# Patient Record
Sex: Male | Born: 1950 | Race: White | Hispanic: No | Marital: Married | Smoking: Former smoker
Health system: Southern US, Community
[De-identification: ages and names within clinical notes are randomized; demographics above are authoritative.]

## PROBLEM LIST (undated history)

## (undated) DIAGNOSIS — E785 Hyperlipidemia, unspecified: Secondary | ICD-10-CM

## (undated) DIAGNOSIS — I519 Heart disease, unspecified: Secondary | ICD-10-CM

## (undated) DIAGNOSIS — I219 Acute myocardial infarction, unspecified: Secondary | ICD-10-CM

## (undated) DIAGNOSIS — F419 Anxiety disorder, unspecified: Secondary | ICD-10-CM

## (undated) DIAGNOSIS — I499 Cardiac arrhythmia, unspecified: Secondary | ICD-10-CM

## (undated) DIAGNOSIS — J45909 Unspecified asthma, uncomplicated: Secondary | ICD-10-CM

## (undated) DIAGNOSIS — I10 Essential (primary) hypertension: Secondary | ICD-10-CM

## (undated) DIAGNOSIS — K219 Gastro-esophageal reflux disease without esophagitis: Secondary | ICD-10-CM

## (undated) HISTORY — PX: CORONARY ANGIOPLASTY WITH STENT PLACEMENT: SHX49

## (undated) HISTORY — PX: OTHER SURGICAL HISTORY: SHX169

## (undated) HISTORY — DX: Hyperlipidemia, unspecified: E78.5

## (undated) HISTORY — DX: Anxiety disorder, unspecified: F41.9

## (undated) HISTORY — DX: Cardiac arrhythmia, unspecified: I49.9

## (undated) HISTORY — DX: Gastro-esophageal reflux disease without esophagitis: K21.9

## (undated) HISTORY — DX: Unspecified asthma, uncomplicated: J45.909

## (undated) HISTORY — DX: Heart disease, unspecified: I51.9

## (undated) HISTORY — DX: Acute myocardial infarction, unspecified: I21.9

## (undated) HISTORY — DX: Essential (primary) hypertension: I10

---

## 2006-08-26 ENCOUNTER — Ambulatory Visit: Payer: Self-pay | Admitting: Emergency Medicine

## 2006-08-26 ENCOUNTER — Other Ambulatory Visit: Payer: Self-pay

## 2008-08-06 ENCOUNTER — Inpatient Hospital Stay: Payer: Self-pay | Admitting: Internal Medicine

## 2010-04-28 ENCOUNTER — Other Ambulatory Visit: Payer: Self-pay | Admitting: Emergency Medicine

## 2010-05-01 ENCOUNTER — Ambulatory Visit: Payer: Self-pay | Admitting: Emergency Medicine

## 2010-05-09 ENCOUNTER — Ambulatory Visit: Payer: Self-pay | Admitting: Emergency Medicine

## 2014-02-10 ENCOUNTER — Ambulatory Visit: Payer: Self-pay | Admitting: Specialist

## 2017-03-17 ENCOUNTER — Encounter: Payer: Self-pay | Admitting: Urology

## 2017-03-17 ENCOUNTER — Ambulatory Visit: Payer: BLUE CROSS/BLUE SHIELD | Admitting: Urology

## 2017-03-17 VITALS — BP 183/90 | HR 62 | Ht 71.0 in | Wt 310.0 lb

## 2017-03-17 DIAGNOSIS — R350 Frequency of micturition: Secondary | ICD-10-CM

## 2017-03-17 DIAGNOSIS — N401 Enlarged prostate with lower urinary tract symptoms: Secondary | ICD-10-CM | POA: Diagnosis not present

## 2017-03-17 DIAGNOSIS — N138 Other obstructive and reflux uropathy: Secondary | ICD-10-CM

## 2017-03-17 DIAGNOSIS — R3129 Other microscopic hematuria: Secondary | ICD-10-CM | POA: Diagnosis not present

## 2017-03-17 LAB — BLADDER SCAN AMB NON-IMAGING: SCAN RESULT: 44

## 2017-03-18 ENCOUNTER — Encounter: Payer: Self-pay | Admitting: Urology

## 2017-03-18 DIAGNOSIS — R3129 Other microscopic hematuria: Secondary | ICD-10-CM | POA: Insufficient documentation

## 2017-03-18 DIAGNOSIS — N138 Other obstructive and reflux uropathy: Secondary | ICD-10-CM | POA: Insufficient documentation

## 2017-03-18 DIAGNOSIS — N401 Enlarged prostate with lower urinary tract symptoms: Principal | ICD-10-CM | POA: Insufficient documentation

## 2017-03-18 LAB — URINALYSIS, COMPLETE
BILIRUBIN UA: NEGATIVE
GLUCOSE, UA: NEGATIVE
KETONES UA: NEGATIVE
NITRITE UA: NEGATIVE
Specific Gravity, UA: 1.02 (ref 1.005–1.030)
UUROB: 0.2 mg/dL (ref 0.2–1.0)
pH, UA: 6 (ref 5.0–7.5)

## 2017-03-18 LAB — MICROSCOPIC EXAMINATION: Epithelial Cells (non renal): NONE SEEN /hpf (ref 0–10)

## 2017-03-18 NOTE — Progress Notes (Signed)
03/17/2017 7:31 AM   Travis ShaggyAndrew Graham Vinton Jr. 01/25/1951 [id number]   Chief Complaint  Patient presents with  . New Patient (Initial Visit)  . Urinary Frequency    HPI: Travis Miller is a 66 year old male who presents for evaluation of the lower urinary tract symptoms.  He relates to a long history of progressively worsening lower urinary tract symptoms.  His voiding symptoms include urinary frequency, urgency, intermittent urinary stream, urinary hesitancy and straining to urinate.  He denies dysuria or gross hematuria.  He denies flank, abdominal, pelvic or scrotal pain.  He was on tamsulosin however states he "felt horrible" on this medication and could not tolerate.  He has been on dutasteride for approximately 2 months and states she is already seeing improvement in his voiding pattern however does suffer some sexual side effects of decreased libido.  He has done research on minimally invasive treatment options and was particularly interested in having a prostatic artery embolization.  He had a friend treated with uro-lift who did not have a good outcome and he is not interested in this form of therapy.  He has done some reading on Rezum and is interested in this treatment also.   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  He states he last saw a urologist 20 years ago and had a negative cystoscopy and upper tract imaging.  He is a former smoker and quit 26 years ago.   PMH: Past Medical History:  Diagnosis Date  . Acid reflux   . Anxiety   . Arrhythmia   . Asthma   . Heart attack (HCC)   . Heart disease   . Hyperlipidemia   . Hypertension     Surgical History: Past Surgical History:  Procedure Laterality Date  . broken ankle    . CORONARY ANGIOPLASTY WITH STENT PLACEMENT      Home Medications:  Allergies as of 03/17/2017      Reactions   Flomax [tamsulosin]       Medication List        Accurate as of  03/17/17 11:59 PM. Always use your most recent med list.          aspirin EC 81 MG tablet Take 81 mg by mouth daily.   atenolol 25 MG tablet Commonly known as:  TENORMIN   dutasteride 0.5 MG capsule Commonly known as:  AVODART Take by mouth daily.   PARoxetine 20 MG tablet Commonly known as:  PAXIL Take 20 mg by mouth daily.   ramipril 10 MG capsule Commonly known as:  ALTACE   rosuvastatin 20 MG tablet Commonly known as:  CRESTOR Take 20 mg by mouth daily.   Vitamin D (Ergocalciferol) 50000 units Caps capsule Commonly known as:  DRISDOL Take by mouth.       Allergies:  Allergies  Allergen Reactions  . Flomax [Tamsulosin]     Family History: Family History  Problem Relation Age of Onset  . Tuberculosis Sister   . Kidney cancer Neg Hx   . Kidney disease Neg Hx   . Prostate cancer Neg Hx     Social History:  reports that he has quit smoking. He does not have any smokeless tobacco history on file. He reports that he does not drink alcohol or use drugs.  ROS: UROLOGY Frequent Urination?: Yes Hard to postpone urination?: Yes Burning/pain with urination?: No Get up at night to urinate?: Yes Leakage of urine?: Yes Urine stream  starts and stops?: Yes Trouble starting stream?: Yes Do you have to strain to urinate?: Yes Blood in urine?: No Urinary tract infection?: No Sexually transmitted disease?: No Injury to kidneys or bladder?: No Painful intercourse?: No Weak stream?: Yes Erection problems?: Yes Penile pain?: No  Gastrointestinal Nausea?: No Vomiting?: No Indigestion/heartburn?: No Diarrhea?: No Constipation?: No  Constitutional Fever: No Night sweats?: No Weight loss?: No Fatigue?: No  Skin Skin rash/lesions?: Yes Itching?: Yes  Eyes Blurred vision?: No Double vision?: No  Ears/Nose/Throat Sore throat?: No Sinus problems?: No  Hematologic/Lymphatic Swollen glands?: No Easy bruising?: No  Cardiovascular Leg swelling?:  Yes Chest pain?: No  Respiratory Cough?: No Shortness of breath?: No  Endocrine Excessive thirst?: No  Musculoskeletal Back pain?: Yes Joint pain?: Yes  Neurological Headaches?: No Dizziness?: No  Psychologic Depression?: No Anxiety?: No  Physical Exam: BP (!) 183/90   Pulse 62   Ht 5\' 11"  (1.803 m)   Wt (!) 310 lb (140.6 kg)   BMI 43.24 kg/m   Constitutional:  Alert and oriented, No acute distress. HEENT: [place] AT, moist mucus membranes.  Trachea midline, no masses. Cardiovascular: No clubbing, cyanosis, or edema. Respiratory: Normal respiratory effort, no increased work of breathing. GI: Abdomen is soft, nontender, nondistended, no abdominal masses GU: No CVA tenderness.  Prostate 50 g, smooth without nodules. Skin: No rashes, bruises or suspicious lesions. Lymph: No cervical or inguinal adenopathy. Neurologic: Grossly intact, no focal deficits, moving all 4 extremities. Psychiatric: Normal mood and affect.  Laboratory Data:  Urinalysis Dipstick 1+ leukocytes, 2+ blood, 1+ protein/microscopy 6-10 WBC/3-10 RBC.  Pertinent Imaging: N/A  Assessment & Plan:   1.  BPH with lower urinary tract symptoms I did discuss prostatic artery embolization and he was informed that this typically is not a first or second line procedure and is generally not covered by insurance.  He was offered [facility] if he desires additional information.  We discussed Rezum and he was informed that we are currently not doing this procedure here however there are several practices in the [place] area who have been doing for 2+ years.  He was informed that he will most likely continue to receive benefit from the dutasteride after he has been on this medication for 4-6 months.  He would like to continue this medication and reassess after he has been on for 6 months.  I did discuss PSA screening and reviewed current AUA recommendations.  He would like to pursue  however since he has been on dutasteride for 2 months will check this after he has been on approximately 6 months.  PVR by bladder scan was 44 mL.  - Urinalysis, Complete - BLADDER SCAN AMB NON-IMAGING  2. Microscopic hematuria Urinalysis today did show pyuria and microhematuria.  Urine culture was ordered.  He would like to proceed with reassessment of his microhematuria and will schedule a renal ultrasound and cystoscopy.  - Ultrasound renal complete; Future - CULTURE, URINE COMPREHENSIVE    Riki AltesScott C Infantof Villagomez, MD  [facility] [address], [address] [place], [place] [address] [phone]

## 2017-03-19 LAB — CULTURE, URINE COMPREHENSIVE

## 2017-05-07 ENCOUNTER — Encounter: Payer: Self-pay | Admitting: Urology

## 2017-05-07 ENCOUNTER — Ambulatory Visit
Admission: RE | Admit: 2017-05-07 | Discharge: 2017-05-07 | Disposition: A | Discharge status: Home / Self Care | Payer: BLUE CROSS/BLUE SHIELD | Source: Ambulatory Visit | Attending: Urology | Admitting: Urology

## 2017-05-07 ENCOUNTER — Ambulatory Visit: Payer: BLUE CROSS/BLUE SHIELD | Admitting: Urology

## 2017-05-07 VITALS — BP 173/78 | HR 68 | Ht 71.0 in | Wt 300.0 lb

## 2017-05-07 DIAGNOSIS — R3129 Other microscopic hematuria: Secondary | ICD-10-CM | POA: Insufficient documentation

## 2017-05-07 LAB — URINALYSIS, COMPLETE
BILIRUBIN UA: NEGATIVE
GLUCOSE, UA: NEGATIVE
KETONES UA: NEGATIVE
NITRITE UA: NEGATIVE
Protein, UA: NEGATIVE
SPEC GRAV UA: 1.02 (ref 1.005–1.030)
Urobilinogen, Ur: 0.2 mg/dL (ref 0.2–1.0)
pH, UA: 6 (ref 5.0–7.5)

## 2017-05-07 LAB — MICROSCOPIC EXAMINATION
BACTERIA UA: NONE SEEN
Epithelial Cells (non renal): NONE SEEN /hpf (ref 0–10)

## 2017-05-07 MED ORDER — CIPROFLOXACIN HCL 500 MG PO TABS
500.0000 mg | ORAL_TABLET | Freq: Once | ORAL | Status: AC
  Administered 2017-05-07: 500 mg via ORAL

## 2017-05-07 MED ORDER — LIDOCAINE HCL 2 % EX GEL
1.0000 "application " | Freq: Once | CUTANEOUS | Status: AC
  Administered 2017-05-07: 1 via URETHRAL

## 2017-05-07 NOTE — Progress Notes (Signed)
   05/07/17  CC:  Chief Complaint  Patient presents with  . Cysto    HPI:  Blood pressure (!) 173/78, pulse 68, height 5\' 11"  (1.803 m), weight 300 lb (136.1 kg). NED. A&Ox3.     Cystoscopy Procedure Note  Patient identification was confirmed, informed consent was obtained, and patient was prepped using Betadine solution.  Lidocaine jelly was administered per urethral meatus.    Preoperative abx where received prior to procedure.     Pre-Procedure: - Inspection reveals a normal caliber ureteral meatus.  Procedure: The flexible cystoscope was introduced without difficulty - No urethral strictures/lesions are present. - Enlarged prostate with touching lateral lobes and a large intravesical median lobe - Elevated bladder neck - Bilateral ureteral orifices identified - Bladder mucosa  reveals no ulcers, tumors, or lesions - No bladder stones - No trabeculation  Retroflexion shows intravesical median lobe   Post-Procedure: - Patient tolerated the procedure well  Assessment/ Plan: Significant BPH.  Sxs are stable; discussed surgical and minimally invasive treatment options.  He desires to continue medical management.  Follow up 3 months.

## 2017-05-08 ENCOUNTER — Encounter: Payer: Self-pay | Admitting: Family Medicine

## 2017-08-05 ENCOUNTER — Ambulatory Visit: Payer: BLUE CROSS/BLUE SHIELD | Admitting: Urology

## 2018-01-22 ENCOUNTER — Encounter: Payer: Self-pay | Admitting: Urology

## 2018-01-22 ENCOUNTER — Ambulatory Visit: Payer: BLUE CROSS/BLUE SHIELD | Admitting: Urology

## 2018-01-22 VITALS — BP 187/108 | HR 64 | Ht 71.0 in | Wt 373.8 lb

## 2018-01-22 DIAGNOSIS — R3129 Other microscopic hematuria: Secondary | ICD-10-CM

## 2018-01-22 DIAGNOSIS — N138 Other obstructive and reflux uropathy: Secondary | ICD-10-CM | POA: Diagnosis not present

## 2018-01-22 DIAGNOSIS — N401 Enlarged prostate with lower urinary tract symptoms: Secondary | ICD-10-CM

## 2018-01-22 LAB — URINALYSIS, COMPLETE
Bilirubin, UA: NEGATIVE
Glucose, UA: NEGATIVE
Ketones, UA: NEGATIVE
LEUKOCYTES UA: NEGATIVE
Nitrite, UA: NEGATIVE
PH UA: 7.5 (ref 5.0–7.5)
Specific Gravity, UA: 1.02 (ref 1.005–1.030)
Urobilinogen, Ur: 1 mg/dL (ref 0.2–1.0)

## 2018-01-22 LAB — MICROSCOPIC EXAMINATION: EPITHELIAL CELLS (NON RENAL): NONE SEEN /HPF (ref 0–10)

## 2018-01-22 NOTE — Progress Notes (Signed)
01/22/2018 1:52 PM   Travis Miller. 1951-03-27 [id number]  Referring provider: No referring provider defined for this encounter.  Chief Complaint  Patient presents with  . Benign Prostatic Hypertrophy    HPI: 67 year old male presents for follow-up of BPH.  He was initially seen November 2018 and was interested in a minimally invasive options for BPH primarily prostatic artery embolization.  He did have a cystoscopy which showed touching lateral lobes and a large intravesical median lobe.  He elected medical therapy with dutasteride which he has been taking for greater than 6 months.  He has not seen any significant improvement in his voiding pattern but is continuing to take for a prophylactic benefit.  He states he did some research on Imodium for lower urinary tract symptoms and is taking 3 capsules/day which he feels is helping his voiding pattern.   PMH: Past Medical History:  Diagnosis Date  . Acid reflux   . Anxiety   . Arrhythmia   . Asthma   . Heart attack (HCC)   . Heart disease   . Hyperlipidemia   . Hypertension     Surgical History: Past Surgical History:  Procedure Laterality Date  . broken ankle    . CORONARY ANGIOPLASTY WITH STENT PLACEMENT      Home Medications:  Allergies as of 01/22/2018      Reactions   Flomax [tamsulosin]       Medication List        Accurate as of 01/22/18  1:52 PM. Always use your most recent med list.          acetaminophen-codeine 300-30 MG tablet Commonly known as:  TYLENOL #3 Take 1-2 tablets by mouth every 6 (six) hours as needed. for pain   aspirin EC 81 MG tablet Take 81 mg by mouth daily.   atenolol 25 MG tablet Commonly known as:  TENORMIN   cyclobenzaprine 10 MG tablet Commonly known as:  FLEXERIL TAKE 1 TABLET BY MOUTH EVERY 8 HOURS AS NEEDED FOR SPASM   dutasteride 0.5 MG capsule Commonly known as:  AVODART Take by mouth daily.   fluocinonide cream 0.05 % Commonly known as:  LIDEX See  admin instructions.   hydrochlorothiazide 25 MG tablet Commonly known as:  HYDRODIURIL   methocarbamol 500 MG tablet Commonly known as:  ROBAXIN TAKE TWO TABLETS BY MOUTH EVERY 6 HOURS FOR SPASM   PARoxetine 20 MG tablet Commonly known as:  PAXIL Take 20 mg by mouth daily.   predniSONE 10 MG (48) Tbpk tablet Commonly known as:  STERAPRED UNI-PAK 48 TAB See admin instructions.   ramipril 10 MG capsule Commonly known as:  ALTACE   rosuvastatin 20 MG tablet Commonly known as:  CRESTOR Take 20 mg by mouth daily.   Vitamin D (Ergocalciferol) 50000 units Caps capsule Commonly known as:  DRISDOL Take by mouth.       Allergies:  Allergies  Allergen Reactions  . Flomax [Tamsulosin]     Family History: Family History  Problem Relation Age of Onset  . Tuberculosis Sister   . Kidney cancer Neg Hx   . Kidney disease Neg Hx   . Prostate cancer Neg Hx     Social History:  reports that he has quit smoking. He has never used smokeless tobacco. He reports that he does not drink alcohol or use drugs.  ROS: UROLOGY Frequent Urination?: Yes Hard to postpone urination?: Yes Burning/pain with urination?: No Get up at night to urinate?: Yes Leakage of  urine?: Yes Urine stream starts and stops?: Yes Trouble starting stream?: Yes Do you have to strain to urinate?: Yes Blood in urine?: No Urinary tract infection?: No Sexually transmitted disease?: No Injury to kidneys or bladder?: No Painful intercourse?: No Weak stream?: Yes Erection problems?: Yes Penile pain?: No  Gastrointestinal Nausea?: No Vomiting?: No Indigestion/heartburn?: No Diarrhea?: No Constipation?: No  Constitutional Fever: No Night sweats?: No Weight loss?: No Fatigue?: No  Skin Skin rash/lesions?: No Itching?: No  Eyes Blurred vision?: No Double vision?: No  Ears/Nose/Throat Sore throat?: No Sinus problems?: No  Hematologic/Lymphatic Swollen glands?: No Easy bruising?:  No  Cardiovascular Leg swelling?: No Chest pain?: No  Respiratory Cough?: No Shortness of breath?: No  Endocrine Excessive thirst?: No  Musculoskeletal Back pain?: Yes Joint pain?: No  Neurological Headaches?: No Dizziness?: No  Psychologic Depression?: No Anxiety?: No  Physical Exam: BP (!) 187/108 (BP Location: Left Arm, Patient Position: Sitting, Cuff Size: Large)   Pulse 64   Ht 5\' 11"  (1.803 m)   Wt (!) 373 lb 12.8 oz (169.6 kg)   BMI 52.13 kg/m   Constitutional:  Alert and oriented, No acute distress.  Assessment & Plan:   67 year old male with BPH and lower urinary tract symptoms.  He again inquired about prostatic artery normalization.  He wants to consider this in the future but wants to wait until [facility] has a program in place.  I did discuss [facility]LE[facility] and he would be an excellent candidate.  He indicated he will continue medical management and return here as needed.   Riki Altes, MD  [facility] [address] [place], [place] [address] [phone]

## 2018-04-17 IMAGING — US US RENAL
1 series · 14 of 25 positions shown · non-contrast
Comparison: None.

CLINICAL DATA: Microscopic hematuria for several years

EXAM:
RENAL / URINARY TRACT ULTRASOUND COMPLETE

[Series 1: us renal · 0.31mm/px · 14 of 68 slices shown]
[im 1/68]
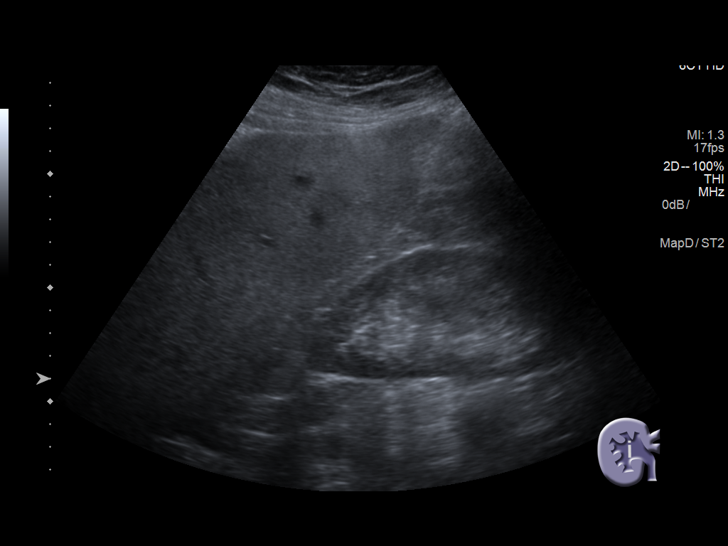
[im 6/68]
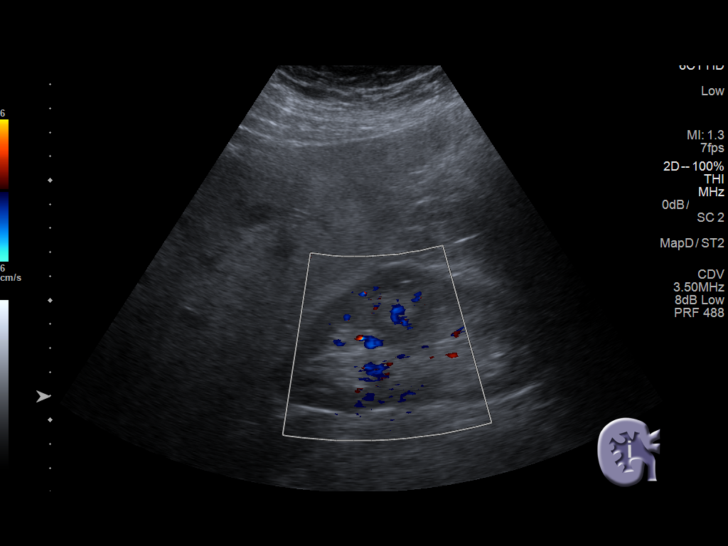
[im 12/68]
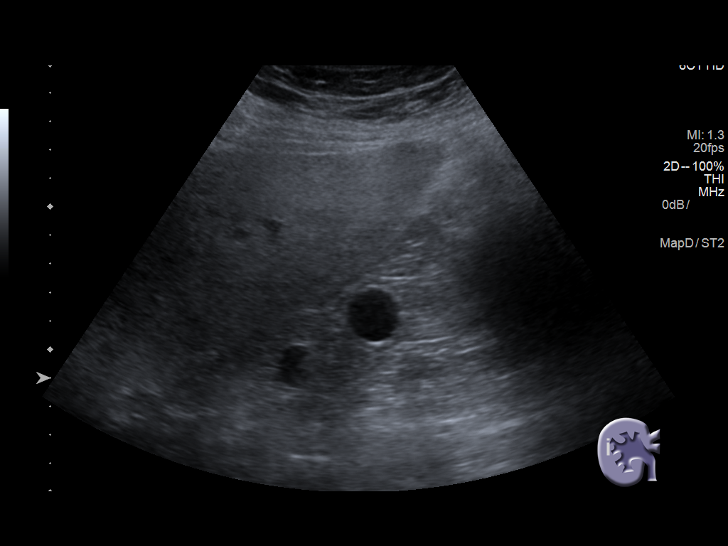
[im 17/68]
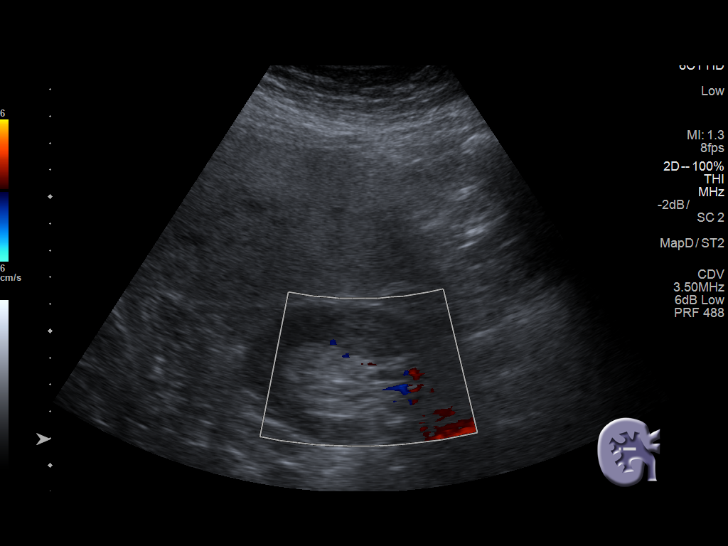
[im 23/68]
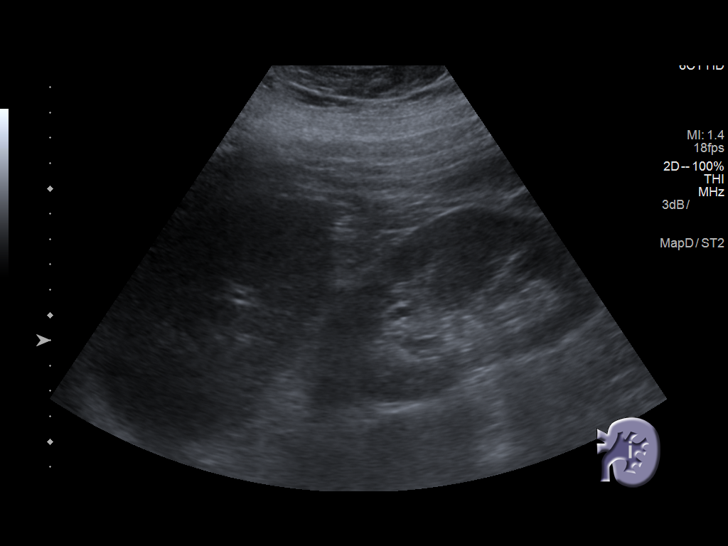
[im 26/68]
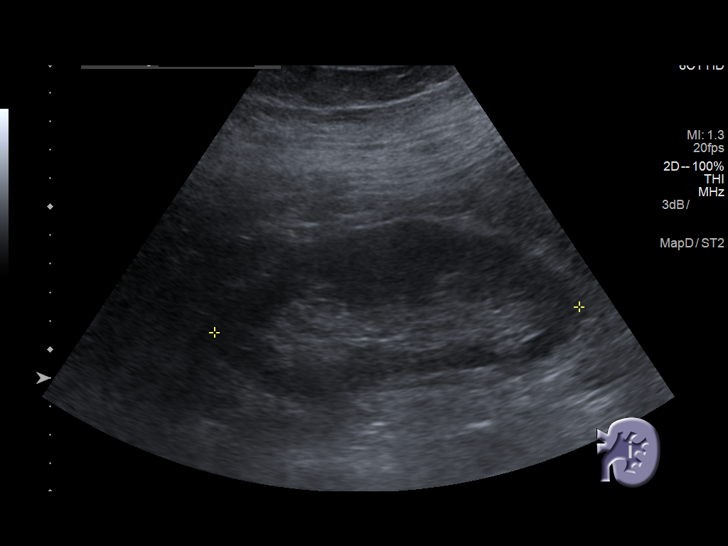
[im 31/68]
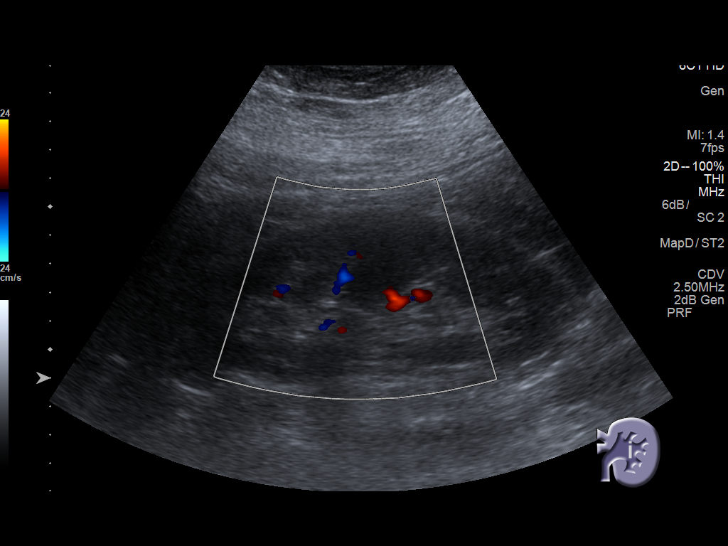
[im 37/68]
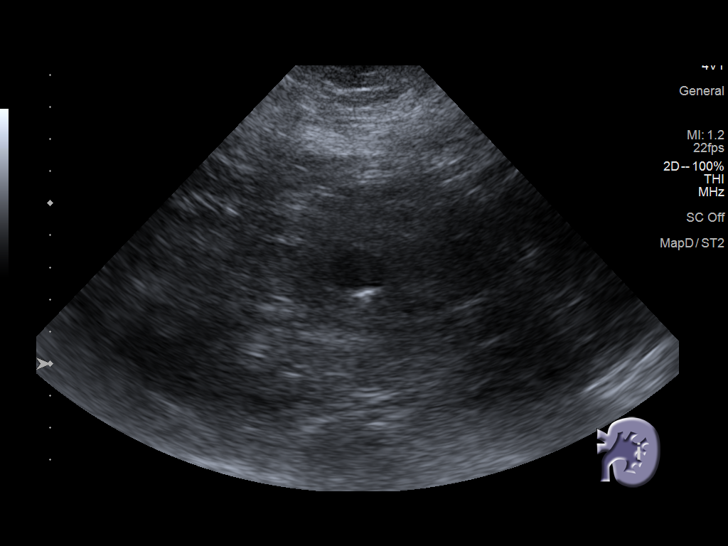
[im 42/68]
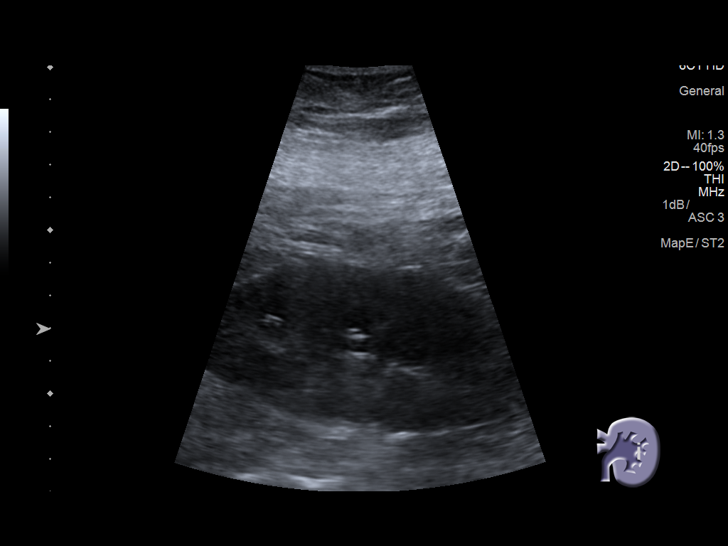
[im 45/68]
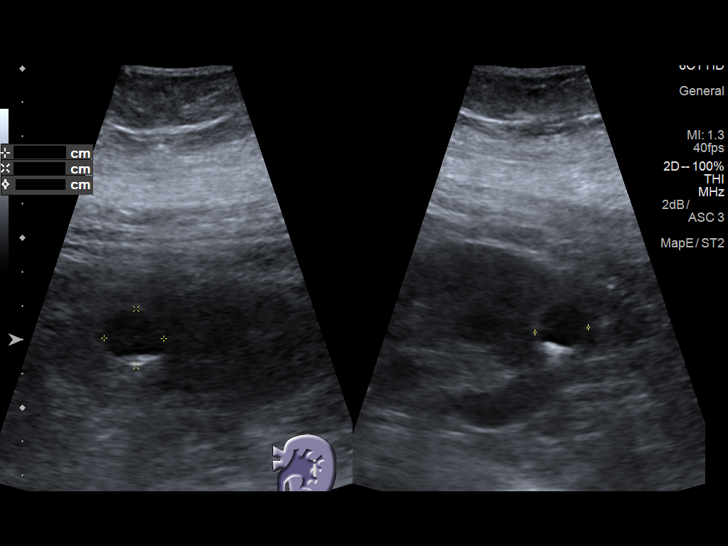
[im 51/68]
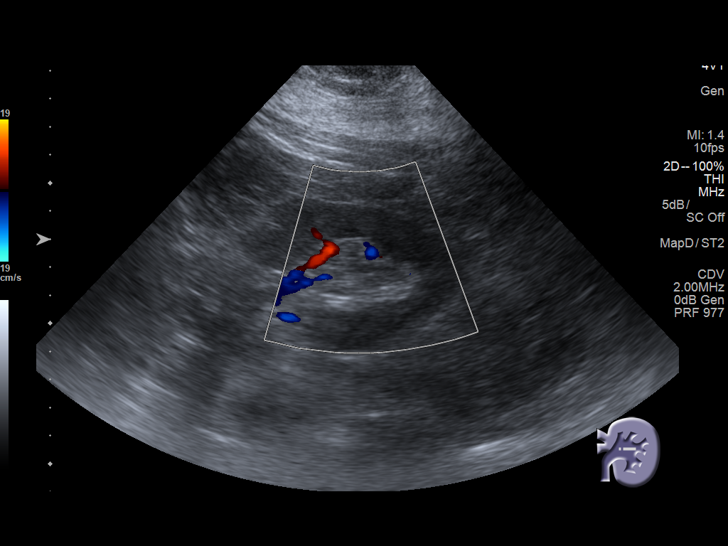
[im 56/68]
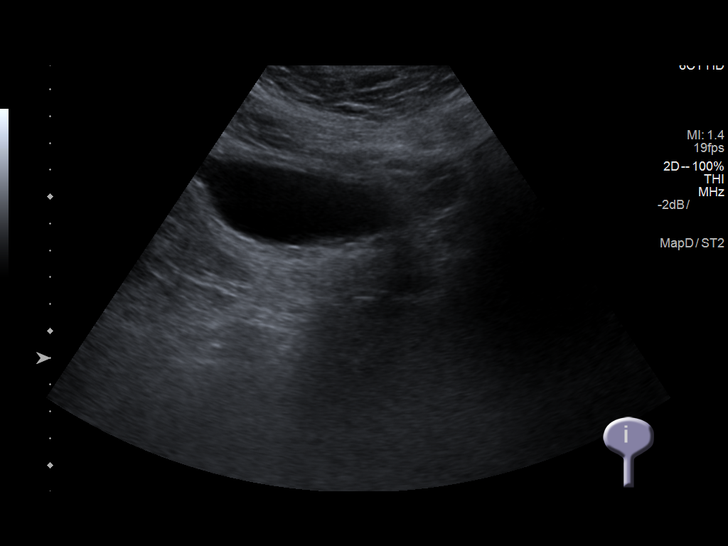
[im 62/68]
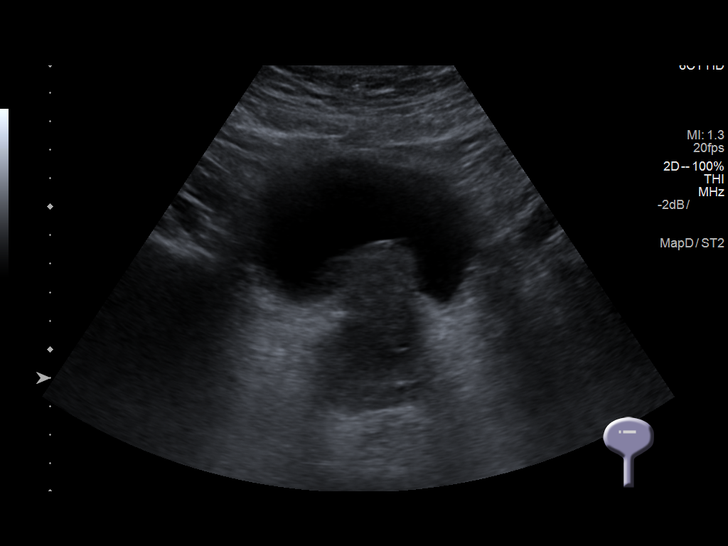
[im 68/68]
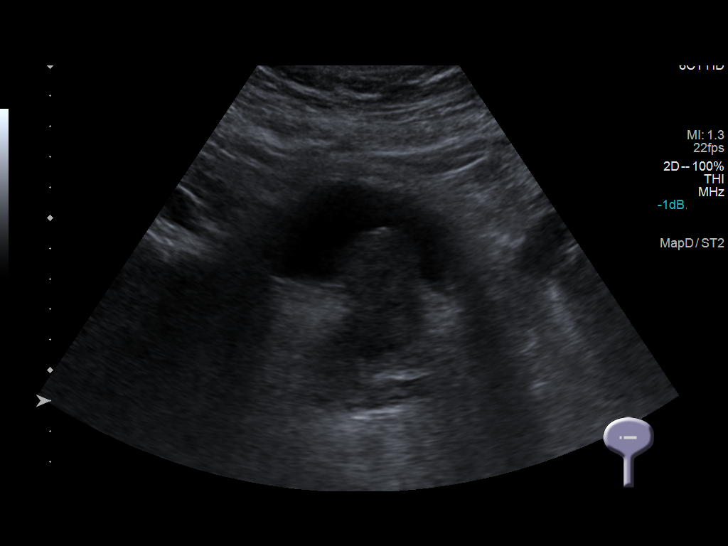

[14 of 25 positions shown; findings below may reference images not displayed]

FINDINGS: Right Kidney:

Length: 12.5 cm.. No hydronephrosis is seen. There are several cysts
present the largest in the mid lateral kidney of 2.1 cm in maximum
diameter. The parenchyma of the right kidney is unremarkable.

Left Kidney:

Length: 12.8 cm.. No hydronephrosis is noted. The parenchyma appears
unremarkable. Several cysts are present, the largest in the upper
pole of 2.0 cm.

Bladder:

The urinary bladder is unremarkable, with some indentation by the
medial lobe of prostate.
IMPRESSION: 1. No renal lesion. No hydronephrosis. No calculi are evident by
ultrasound.
2. Some prominence of the median lobe of the prostate gland.

## 2018-09-20 DEATH — deceased

## 2021-07-05 ENCOUNTER — Ambulatory Visit (INDEPENDENT_AMBULATORY_CARE_PROVIDER_SITE_OTHER): Payer: Medicare Other | Admitting: Urology

## 2021-07-05 ENCOUNTER — Other Ambulatory Visit: Payer: Self-pay

## 2021-07-05 ENCOUNTER — Encounter: Payer: Self-pay | Admitting: Urology

## 2021-07-05 VITALS — BP 186/97 | HR 64 | Ht 71.0 in | Wt 377.0 lb

## 2021-07-05 DIAGNOSIS — N390 Urinary tract infection, site not specified: Secondary | ICD-10-CM | POA: Diagnosis not present

## 2021-07-05 DIAGNOSIS — N401 Enlarged prostate with lower urinary tract symptoms: Secondary | ICD-10-CM

## 2021-07-05 DIAGNOSIS — N400 Enlarged prostate without lower urinary tract symptoms: Secondary | ICD-10-CM

## 2021-07-05 LAB — URINALYSIS, COMPLETE
Bilirubin, UA: NEGATIVE
Ketones, UA: NEGATIVE
Leukocytes,UA: NEGATIVE
Nitrite, UA: NEGATIVE
Specific Gravity, UA: >1.03 — ABNORMAL HIGH (ref 1.005–1.030)
Urobilinogen, Ur: 0.2 mg/dL (ref 0.2–1.0)
pH, UA: 5.5 (ref 5.0–7.5)

## 2021-07-05 LAB — MICROSCOPIC EXAMINATION: Bacteria, UA: NONE SEEN

## 2021-07-05 LAB — BLADDER SCAN AMB NON-IMAGING: Scan Result: 57

## 2021-07-06 NOTE — Progress Notes (Signed)
? ?07/05/2021 ?1:11 PM  ? ?Mignon Pine. ?07/13/1950 ?[id number] ? ?Referring provider: No referring provider defined for this encounter. ? ?Chief Complaint  ?Patient presents with  ? Benign Prostatic Hypertrophy  ? ? ?HPI: ?71 y.o. male previously seen for BPH presents for reestablished visit. ? ?Long history of BPH presently on combination therapy ?Has been interested in PAE but symptoms have been stable on combination therapy ?Recently been treated for recurrent UTI. ?He has had positive urine cultures for Enterococcus that have been sensitive to penicillin, Macrodantin and for quinolones.  He states he has been treated with Macrobid and fosfomycin.  Symptoms improved then returned.  He is currently on a repeat course of nitrofurantoin without UTI symptoms ?Prior cystoscopy with lateral lobe enlargement and a large intravesical median lobe ? ? ?PMH: ?Past Medical History:  ?Diagnosis Date  ? Acid reflux   ? Anxiety   ? Arrhythmia   ? Asthma   ? Heart attack ([place])   ? Heart disease   ? Hyperlipidemia   ? Hypertension   ? ? ?Surgical History: ?Past Surgical History:  ?Procedure Laterality Date  ? broken ankle    ? CORONARY ANGIOPLASTY WITH STENT PLACEMENT    ? ? ?Home Medications:  ?Allergies as of 07/05/2021   ? ?   Reactions  ? Sulfamethoxazole-trimethoprim Other (See Comments)  ? Redness and flushed  ? Flomax [tamsulosin]   ? ?  ? ?  ?Medication List  ?  ? ?  ? Accurate as of July 05, 2021 11:59 PM. If you have any questions, ask your nurse or doctor.  ?  ?  ? ?  ? ?acetaminophen-codeine 300-30 MG tablet ?Commonly known as: TYLENOL #3 ?Take 1-2 tablets by mouth every 6 (six) hours as needed. for pain ?  ?aspirin EC 81 MG tablet ?Take 81 mg by mouth daily. ?  ?atenolol 25 MG tablet ?Commonly known as: TENORMIN ?  ?cyclobenzaprine 10 MG tablet ?Commonly known as: FLEXERIL ?TAKE 1 TABLET BY MOUTH EVERY 8 HOURS AS NEEDED FOR SPASM ?  ?dutasteride 0.5 MG capsule ?Commonly known as: AVODART ?Take by mouth  daily. ?  ?fluocinonide cream 0.05 % ?Commonly known as: LIDEX ?See admin instructions. ?  ?hydrochlorothiazide 25 MG tablet ?Commonly known as: HYDRODIURIL ?  ?methocarbamol 500 MG tablet ?Commonly known as: ROBAXIN ?TAKE TWO TABLETS BY MOUTH EVERY 6 HOURS FOR SPASM ?  ?PARoxetine 20 MG tablet ?Commonly known as: PAXIL ?Take 20 mg by mouth daily. ?  ?predniSONE 10 MG (48) Tbpk tablet ?Commonly known as: STERAPRED UNI-PAK 48 TAB ?See admin instructions. ?  ?ramipril 10 MG capsule ?Commonly known as: ALTACE ?  ?rosuvastatin 20 MG tablet ?Commonly known as: CRESTOR ?Take 20 mg by mouth daily. ?  ?Vitamin D (Ergocalciferol) 1.25 MG (50000 UNIT) Caps capsule ?Commonly known as: DRISDOL ?Take by mouth. ?  ? ?  ? ? ?Allergies:  ?Allergies  ?Allergen Reactions  ? Sulfamethoxazole-Trimethoprim Other (See Comments)  ?  Redness and flushed  ? Flomax [Tamsulosin]   ? ? ?Family History: ?Family History  ?Problem Relation Age of Onset  ? Tuberculosis Sister   ? Kidney cancer Neg Hx   ? Kidney disease Neg Hx   ? Prostate cancer Neg Hx   ? ? ?Social History:  reports that he has quit smoking. He has never used smokeless tobacco. He reports that he does not drink alcohol and does not use drugs. ? ? ?Physical Exam: ?BP (!) 186/97   Pulse 64  Ht 5\' 11"  (1.803 m)   Wt (!) 377 lb (171 kg)   BMI 52.58 kg/m?   ?Constitutional:  Alert and oriented, No acute distress. ?Psychiatric: Normal mood and affect. ? ?Laboratory Data: ? ?Urinalysis ?Dipstick 2+ blood ?Microscopy 3-10 RBC/no significant WBC/no bacteria ? ? ?Assessment & Plan:   ? ?1.  BPH with LUTS ?Bladder scan PVR 57 mL ?He is still interested in pursuing PAE and would recommend Dr. Joellen Jersey in [place] ? ?2.  Recurrent UTI ?We discussed that nitrofurantoin is an excellent antibiotic for uncomplicated UTI.  With his BPH he most likely has a prostatic infection and nitrofurantoin does not achieve adequate tissue levels.  He is presently asymptomatic and urinalysis  unremarkable ?If he does have recurrent symptoms after completing his antibiotic course would recommend a 3-week course of amoxicillin 875 mg twice daily ? ? ?Abbie Sons, MD ? ?[facility] ?[address] ?[place], [place] [address] ?[phone] ?
